# Patient Record
Sex: Female | Born: 1945 | Race: White | Hispanic: No | Marital: Single | Smoking: Never smoker
Health system: Southern US, Community
[De-identification: ages and names within clinical notes are randomized; demographics above are authoritative.]

## PROBLEM LIST (undated history)

## (undated) DIAGNOSIS — N83209 Unspecified ovarian cyst, unspecified side: Secondary | ICD-10-CM

## (undated) DIAGNOSIS — Z780 Asymptomatic menopausal state: Secondary | ICD-10-CM

## (undated) DIAGNOSIS — H9190 Unspecified hearing loss, unspecified ear: Secondary | ICD-10-CM

## (undated) DIAGNOSIS — M47812 Spondylosis without myelopathy or radiculopathy, cervical region: Secondary | ICD-10-CM

## (undated) DIAGNOSIS — M8580 Other specified disorders of bone density and structure, unspecified site: Secondary | ICD-10-CM

## (undated) DIAGNOSIS — M47816 Spondylosis without myelopathy or radiculopathy, lumbar region: Secondary | ICD-10-CM

## (undated) DIAGNOSIS — B001 Herpesviral vesicular dermatitis: Secondary | ICD-10-CM

## (undated) DIAGNOSIS — K589 Irritable bowel syndrome without diarrhea: Secondary | ICD-10-CM

## (undated) HISTORY — DX: Unspecified ovarian cyst, unspecified side: N83.209

## (undated) HISTORY — DX: Herpesviral vesicular dermatitis: B00.1

## (undated) HISTORY — PX: COLONOSCOPY: SHX174

## (undated) HISTORY — DX: Asymptomatic menopausal state: Z78.0

## (undated) HISTORY — DX: Unspecified hearing loss, unspecified ear: H91.90

## (undated) HISTORY — DX: Irritable bowel syndrome, unspecified: K58.9

## (undated) HISTORY — DX: Other specified disorders of bone density and structure, unspecified site: M85.80

## (undated) HISTORY — DX: Spondylosis without myelopathy or radiculopathy, lumbar region: M47.816

## (undated) HISTORY — DX: Spondylosis without myelopathy or radiculopathy, cervical region: M47.812

---

## 1974-07-01 HISTORY — PX: SALPINGOOPHORECTOMY: SHX82

## 1998-06-05 ENCOUNTER — Ambulatory Visit (HOSPITAL_COMMUNITY): Admission: RE | Admit: 1998-06-05 | Discharge: 1998-06-05 | Payer: Self-pay | Admitting: Obstetrics and Gynecology

## 1999-06-06 ENCOUNTER — Ambulatory Visit (HOSPITAL_COMMUNITY): Admission: RE | Admit: 1999-06-06 | Discharge: 1999-06-06 | Payer: Self-pay | Admitting: Obstetrics and Gynecology

## 1999-06-06 ENCOUNTER — Encounter: Payer: Self-pay | Admitting: Obstetrics and Gynecology

## 2000-06-11 ENCOUNTER — Encounter: Payer: Self-pay | Admitting: Obstetrics and Gynecology

## 2000-06-11 ENCOUNTER — Ambulatory Visit (HOSPITAL_COMMUNITY): Admission: RE | Admit: 2000-06-11 | Discharge: 2000-06-11 | Payer: Self-pay | Admitting: Obstetrics and Gynecology

## 2000-06-13 ENCOUNTER — Encounter: Admission: RE | Admit: 2000-06-13 | Discharge: 2000-06-13 | Payer: Self-pay | Admitting: Obstetrics and Gynecology

## 2000-06-13 ENCOUNTER — Encounter: Payer: Self-pay | Admitting: Obstetrics and Gynecology

## 2001-06-12 ENCOUNTER — Other Ambulatory Visit: Admission: RE | Admit: 2001-06-12 | Discharge: 2001-06-12 | Payer: Self-pay | Admitting: Obstetrics and Gynecology

## 2001-06-16 ENCOUNTER — Encounter: Admission: RE | Admit: 2001-06-16 | Discharge: 2001-06-16 | Payer: Self-pay | Admitting: Obstetrics and Gynecology

## 2001-06-16 ENCOUNTER — Encounter: Payer: Self-pay | Admitting: Obstetrics and Gynecology

## 2002-06-16 ENCOUNTER — Ambulatory Visit (HOSPITAL_COMMUNITY): Admission: RE | Admit: 2002-06-16 | Discharge: 2002-06-16 | Payer: Self-pay | Admitting: Obstetrics and Gynecology

## 2002-06-16 ENCOUNTER — Other Ambulatory Visit: Admission: RE | Admit: 2002-06-16 | Discharge: 2002-06-16 | Payer: Self-pay | Admitting: Obstetrics and Gynecology

## 2002-06-16 ENCOUNTER — Encounter: Payer: Self-pay | Admitting: Obstetrics and Gynecology

## 2003-10-22 ENCOUNTER — Emergency Department (HOSPITAL_COMMUNITY): Admission: EM | Admit: 2003-10-22 | Discharge: 2003-10-22 | Payer: Self-pay | Admitting: Emergency Medicine

## 2004-04-19 ENCOUNTER — Other Ambulatory Visit: Admission: RE | Admit: 2004-04-19 | Discharge: 2004-04-19 | Payer: Self-pay | Admitting: Obstetrics and Gynecology

## 2004-04-19 ENCOUNTER — Ambulatory Visit (HOSPITAL_COMMUNITY): Admission: RE | Admit: 2004-04-19 | Discharge: 2004-04-19 | Payer: Self-pay | Admitting: Obstetrics and Gynecology

## 2005-04-24 ENCOUNTER — Other Ambulatory Visit: Admission: RE | Admit: 2005-04-24 | Discharge: 2005-04-24 | Payer: Self-pay | Admitting: Obstetrics and Gynecology

## 2005-04-24 ENCOUNTER — Ambulatory Visit (HOSPITAL_COMMUNITY): Admission: RE | Admit: 2005-04-24 | Discharge: 2005-04-24 | Payer: Self-pay | Admitting: Obstetrics and Gynecology

## 2006-04-25 ENCOUNTER — Other Ambulatory Visit: Admission: RE | Admit: 2006-04-25 | Discharge: 2006-04-25 | Payer: Self-pay | Admitting: Obstetrics and Gynecology

## 2007-05-04 ENCOUNTER — Other Ambulatory Visit: Admission: RE | Admit: 2007-05-04 | Discharge: 2007-05-04 | Payer: Self-pay | Admitting: Obstetrics and Gynecology

## 2007-05-19 ENCOUNTER — Ambulatory Visit: Payer: Self-pay | Admitting: Gastroenterology

## 2008-05-05 ENCOUNTER — Other Ambulatory Visit: Admission: RE | Admit: 2008-05-05 | Discharge: 2008-05-05 | Payer: Self-pay | Admitting: Obstetrics and Gynecology

## 2008-05-05 ENCOUNTER — Ambulatory Visit: Payer: Self-pay | Admitting: Obstetrics and Gynecology

## 2008-05-05 ENCOUNTER — Encounter: Payer: Self-pay | Admitting: Obstetrics and Gynecology

## 2009-05-15 ENCOUNTER — Other Ambulatory Visit: Admission: RE | Admit: 2009-05-15 | Discharge: 2009-05-15 | Payer: Self-pay | Admitting: Obstetrics and Gynecology

## 2009-05-15 ENCOUNTER — Ambulatory Visit: Payer: Self-pay | Admitting: Obstetrics and Gynecology

## 2009-05-15 ENCOUNTER — Encounter: Payer: Self-pay | Admitting: Obstetrics and Gynecology

## 2009-12-07 ENCOUNTER — Encounter: Admission: RE | Admit: 2009-12-07 | Discharge: 2009-12-07 | Payer: Self-pay | Admitting: Family Medicine

## 2010-05-17 ENCOUNTER — Ambulatory Visit: Payer: Self-pay | Admitting: Obstetrics and Gynecology

## 2010-05-17 ENCOUNTER — Other Ambulatory Visit: Admission: RE | Admit: 2010-05-17 | Discharge: 2010-05-17 | Payer: Self-pay | Admitting: Obstetrics and Gynecology

## 2010-05-18 ENCOUNTER — Ambulatory Visit: Payer: Self-pay | Admitting: Obstetrics and Gynecology

## 2011-05-16 ENCOUNTER — Encounter: Payer: Self-pay | Admitting: Obstetrics and Gynecology

## 2011-05-20 ENCOUNTER — Other Ambulatory Visit: Payer: Self-pay

## 2011-05-20 DIAGNOSIS — N62 Hypertrophy of breast: Secondary | ICD-10-CM

## 2011-05-22 ENCOUNTER — Other Ambulatory Visit: Payer: Self-pay | Admitting: Obstetrics and Gynecology

## 2011-05-22 DIAGNOSIS — N62 Hypertrophy of breast: Secondary | ICD-10-CM

## 2011-05-29 ENCOUNTER — Encounter: Payer: Self-pay | Admitting: *Deleted

## 2011-05-29 DIAGNOSIS — M8580 Other specified disorders of bone density and structure, unspecified site: Secondary | ICD-10-CM | POA: Insufficient documentation

## 2011-06-05 ENCOUNTER — Ambulatory Visit (INDEPENDENT_AMBULATORY_CARE_PROVIDER_SITE_OTHER): Payer: BC Managed Care – PPO | Admitting: Obstetrics and Gynecology

## 2011-06-05 ENCOUNTER — Other Ambulatory Visit (HOSPITAL_COMMUNITY)
Admission: RE | Admit: 2011-06-05 | Discharge: 2011-06-05 | Disposition: A | Discharge status: Home / Self Care | Payer: BC Managed Care – PPO | Source: Ambulatory Visit | Attending: Obstetrics and Gynecology | Admitting: Obstetrics and Gynecology

## 2011-06-05 ENCOUNTER — Encounter: Payer: Self-pay | Admitting: Obstetrics and Gynecology

## 2011-06-05 VITALS — BP 112/64 | Ht 64.0 in | Wt 118.0 lb

## 2011-06-05 DIAGNOSIS — Z01419 Encounter for gynecological examination (general) (routine) without abnormal findings: Secondary | ICD-10-CM | POA: Insufficient documentation

## 2011-06-05 DIAGNOSIS — Z833 Family history of diabetes mellitus: Secondary | ICD-10-CM

## 2011-06-05 DIAGNOSIS — N83209 Unspecified ovarian cyst, unspecified side: Secondary | ICD-10-CM | POA: Insufficient documentation

## 2011-06-05 DIAGNOSIS — Z13220 Encounter for screening for lipoid disorders: Secondary | ICD-10-CM

## 2011-06-05 NOTE — Progress Notes (Signed)
The patient came to see me today for her annual GYN exam. She is not having menopausal symptoms. She is up-to-date on mammograms. She does have atrophic vaginitis but is asymptomatic. She does have low bone mass. She previously took Actonel but has been off of it for a long period of time. Her last bone density was 2007. She is taking calcium and vitamin D. She's had no fractures. She is having no vaginal bleeding or pelvic pain.  Physical examination:  Jeanette Knapp present. HEENT within normal limits. Neck: Thyroid not large. No masses. Supraclavicular nodes: not enlarged. Breasts: Examined in both sitting midline position. No skin changes and no masses. Abdomen: Soft no guarding rebound or masses or hernia. Pelvic: External: Within normal limits. BUS: Within normal limits. Vaginal:within normal limits. Poor estrogen effect. No evidence of cystocele rectocele or enterocele. Cervix: clean. Uterus: Normal size and shape. Adnexa: No masses. Rectovaginal exam: Confirmatory and negative. Extremities: Within normal limits.  Assessment: #1. Atrophic vaginitis #2. Low bone mass Plan: Yearly mammograms. Follow up bone density.

## 2011-06-05 NOTE — Patient Instructions (Signed)
Scheduled bone density

## 2011-06-13 ENCOUNTER — Telehealth: Payer: Self-pay | Admitting: *Deleted

## 2011-06-13 NOTE — Telephone Encounter (Signed)
Pt called wanting to know if her LDL in chol. Lab results. Pt informed that DG didn't order in LDL. Pt okay with this

## 2011-08-08 ENCOUNTER — Other Ambulatory Visit: Payer: Self-pay | Admitting: Family Medicine

## 2011-08-08 DIAGNOSIS — R142 Eructation: Secondary | ICD-10-CM

## 2011-08-09 ENCOUNTER — Ambulatory Visit
Admission: RE | Admit: 2011-08-09 | Discharge: 2011-08-09 | Disposition: A | Discharge status: Home / Self Care | Payer: BC Managed Care – PPO | Source: Ambulatory Visit | Attending: Family Medicine | Admitting: Family Medicine

## 2011-08-09 DIAGNOSIS — R142 Eructation: Secondary | ICD-10-CM

## 2011-12-09 ENCOUNTER — Other Ambulatory Visit (HOSPITAL_COMMUNITY): Payer: Self-pay | Admitting: Family Medicine

## 2011-12-09 DIAGNOSIS — R109 Unspecified abdominal pain: Secondary | ICD-10-CM

## 2011-12-24 ENCOUNTER — Other Ambulatory Visit (HOSPITAL_COMMUNITY): Payer: BC Managed Care – PPO

## 2012-03-01 DIAGNOSIS — S92309A Fracture of unspecified metatarsal bone(s), unspecified foot, initial encounter for closed fracture: Secondary | ICD-10-CM

## 2012-03-01 HISTORY — DX: Fracture of unspecified metatarsal bone(s), unspecified foot, initial encounter for closed fracture: S92.309A

## 2012-06-18 ENCOUNTER — Encounter: Payer: Self-pay | Admitting: Obstetrics and Gynecology

## 2012-10-13 ENCOUNTER — Other Ambulatory Visit: Payer: Self-pay | Admitting: Dermatology

## 2013-12-16 ENCOUNTER — Other Ambulatory Visit: Payer: Self-pay | Admitting: Family Medicine

## 2013-12-16 ENCOUNTER — Ambulatory Visit
Admission: RE | Admit: 2013-12-16 | Discharge: 2013-12-16 | Disposition: A | Discharge status: Home / Self Care | Payer: Medicare Other | Source: Ambulatory Visit | Attending: Family Medicine | Admitting: Family Medicine

## 2013-12-16 DIAGNOSIS — M542 Cervicalgia: Secondary | ICD-10-CM

## 2018-03-27 ENCOUNTER — Emergency Department (HOSPITAL_COMMUNITY)
Admission: EM | Admit: 2018-03-27 | Discharge: 2018-03-27 | Disposition: A | Discharge status: Home / Self Care | Payer: Medicare Other | Attending: Emergency Medicine | Admitting: Emergency Medicine

## 2018-03-27 ENCOUNTER — Emergency Department (HOSPITAL_COMMUNITY): Payer: Medicare Other

## 2018-03-27 ENCOUNTER — Encounter (HOSPITAL_COMMUNITY): Payer: Self-pay | Admitting: *Deleted

## 2018-03-27 DIAGNOSIS — R070 Pain in throat: Secondary | ICD-10-CM | POA: Diagnosis not present

## 2018-03-27 DIAGNOSIS — J36 Peritonsillar abscess: Secondary | ICD-10-CM | POA: Diagnosis not present

## 2018-03-27 DIAGNOSIS — R1310 Dysphagia, unspecified: Secondary | ICD-10-CM | POA: Diagnosis not present

## 2018-03-27 DIAGNOSIS — M542 Cervicalgia: Secondary | ICD-10-CM | POA: Diagnosis present

## 2018-03-27 HISTORY — DX: Peritonsillar abscess: J36

## 2018-03-27 LAB — CBC WITH DIFFERENTIAL/PLATELET
BASOS PCT: 0 %
Basophils Absolute: 0 10*3/uL (ref 0.0–0.1)
EOS ABS: 0 10*3/uL (ref 0.0–0.7)
Eosinophils Relative: 0 %
HCT: 37 % (ref 36.0–46.0)
HEMOGLOBIN: 12.5 g/dL (ref 12.0–15.0)
LYMPHS PCT: 12 %
Lymphs Abs: 2.1 10*3/uL (ref 0.7–4.0)
MCH: 30.2 pg (ref 26.0–34.0)
MCHC: 33.8 g/dL (ref 30.0–36.0)
MCV: 89.4 fL (ref 78.0–100.0)
Monocytes Absolute: 1 10*3/uL (ref 0.1–1.0)
Monocytes Relative: 6 %
NEUTROS ABS: 14.3 10*3/uL — AB (ref 1.7–7.7)
Neutrophils Relative %: 82 %
Platelets: 400 10*3/uL (ref 150–400)
RBC: 4.14 MIL/uL (ref 3.87–5.11)
RDW: 13.3 % (ref 11.5–15.5)
WBC: 17.4 10*3/uL — ABNORMAL HIGH (ref 4.0–10.5)

## 2018-03-27 LAB — BASIC METABOLIC PANEL
ANION GAP: 14 (ref 5–15)
BUN: 7 mg/dL — ABNORMAL LOW (ref 8–23)
CHLORIDE: 101 mmol/L (ref 98–111)
CO2: 26 mmol/L (ref 22–32)
Calcium: 9.4 mg/dL (ref 8.9–10.3)
Creatinine, Ser: 0.8 mg/dL (ref 0.44–1.00)
GFR calc non Af Amer: >60 mL/min (ref >60)
Glucose, Bld: 107 mg/dL — ABNORMAL HIGH (ref 70–99)
Potassium: 3.5 mmol/L (ref 3.5–5.1)
SODIUM: 141 mmol/L (ref 135–145)

## 2018-03-27 MED ORDER — IOHEXOL 300 MG/ML  SOLN
75.0000 mL | Freq: Once | INTRAMUSCULAR | Status: AC | PRN
  Administered 2018-03-27: 75 mL via INTRAVENOUS

## 2018-03-27 MED ORDER — DEXAMETHASONE SODIUM PHOSPHATE 10 MG/ML IJ SOLN
8.0000 mg | Freq: Once | INTRAMUSCULAR | Status: AC
  Administered 2018-03-27: 8 mg via INTRAVENOUS
  Filled 2018-03-27: qty 1

## 2018-03-27 MED ORDER — CLINDAMYCIN PHOSPHATE 600 MG/50ML IV SOLN
600.0000 mg | Freq: Once | INTRAVENOUS | Status: AC
  Administered 2018-03-27: 600 mg via INTRAVENOUS
  Filled 2018-03-27: qty 50

## 2018-03-27 MED ORDER — HYDROCODONE-ACETAMINOPHEN 5-325 MG PO TABS: 1.0000 | ORAL_TABLET | Freq: Four times a day (QID) | ORAL | 0 refills | Status: DC | PRN

## 2018-03-27 MED ORDER — SODIUM CHLORIDE 0.9 % IV BOLUS
1000.0000 mL | Freq: Once | INTRAVENOUS | Status: AC
  Administered 2018-03-27: 1000 mL via INTRAVENOUS

## 2018-03-27 MED ORDER — SODIUM CHLORIDE 0.9 % IJ SOLN
INTRAMUSCULAR | Status: AC
  Filled 2018-03-27: qty 50

## 2018-03-27 MED ORDER — CLINDAMYCIN HCL 150 MG PO CAPS: 150.0000 mg | ORAL_CAPSULE | Freq: Four times a day (QID) | ORAL | 0 refills | Status: DC

## 2018-03-27 MED ORDER — MORPHINE SULFATE (PF) 2 MG/ML IV SOLN
2.0000 mg | Freq: Once | INTRAVENOUS | Status: DC
  Filled 2018-03-27: qty 1

## 2018-03-27 NOTE — ED Triage Notes (Signed)
Pt complains of painful and difficult swallowing, worse since this morning. Pt feels like something is stuck in her throat. Pt recently finished taking z-pack. Pt has been on liquid diet for 3 days due to difficulty swallowing. Pt has ENT appointment today at 3PM.

## 2018-03-27 NOTE — Discharge Instructions (Signed)
Please follow-up with Dr. Annalee Genta  (ENT) at 1:45 this afternoon.  Return for any problem.

## 2018-03-27 NOTE — ED Provider Notes (Signed)
[facility]-EMERGENCY DEPT Provider Note   CSN: [id number] Arrival date & time: 03/27/18  0751     History   Chief Complaint Chief Complaint  Patient presents with  . Dysphagia  . Sore Throat    HPI Jeanette Knapp is a 72 y.o. female.  72 year old female with prior medical history as documented below presents for evaluation of left sided neck pain and discomfort.  Patient reports ongoing symptoms for the last 1-1/2 weeks.  She has been seen by urgent care and her regular doctor for the same complaint.  She has already completed one course of Z-Pak as an outpatient.  She complains of pain to the left side of her neck and difficulty with swallowing.  She denies fever.  She denies prior episodes of similar complaints.  She has been referred to ENT and has an appointment with Dr. Jearld Fenton [facility] ENT) this afternoon at 3 PM.  This morning she felt like her pain was worse and she is having more trouble swallowing so she came to the [facility] ED for evaluation.  She denies difficulty speaking.  She denies difficulty breathing.  She complains of increased pain to the left side of her neck with swallowing.  The history is provided by the patient and medical records.  Sore Throat  This is a new problem. The current episode started more than 1 week ago. The problem occurs rarely. The problem has been gradually worsening. Pertinent negatives include no chest pain, no abdominal pain, no headaches and no shortness of breath. Nothing aggravates the symptoms. Nothing relieves the symptoms.    Past Medical History:  Diagnosis Date  . Fever blister   . Osteopenia   . Ovarian cyst     Patient Active Problem List   Diagnosis Date Noted  . Ovarian cyst   . Osteopenia     Past Surgical History:  Procedure Laterality Date  . SALPINGOOPHORECTOMY  1976   right. ovarian cyst     OB History    Gravida  0   Para      Term      Preterm      AB      Living        SAB      TAB      Ectopic      Multiple      Live Births               Home Medications    Prior to Admission medications   Medication Sig Start Date End Date Taking? Authorizing Provider  ACYCLOVIR PO Take by mouth.      [provider]  Calcium Carbonate Antacid (TUMS PO) Take by mouth.      [provider]  Calcium Carbonate-Vitamin D (CALCIUM + D PO) Take by mouth.      [provider]  Multiple Vitamin (MULTIVITAMIN) capsule Take 1 capsule by mouth daily.      [provider]    Family History Family History  Problem Relation Age of Onset  . Hypertension Mother   . Ovarian cancer Mother   . Cancer Mother        Multiple myloma-Ovarian cancr  . Diabetes Father   . Lung cancer Father   . Heart disease Father   . Colon cancer Maternal Grandfather     Social History Social History   Tobacco Use  . Smoking status: Never Smoker  Substance Use Topics  . Alcohol use: Yes  Alcohol/week: 2.0 standard drinks    Types: 2 Standard drinks or equivalent per week    Comment: rare  . Drug use: Not on file     Allergies   Penicillins and Prozac [fluoxetine hcl]   Review of Systems Review of Systems  Respiratory: Negative for shortness of breath.   Cardiovascular: Negative for chest pain.  Gastrointestinal: Negative for abdominal pain.  Neurological: Negative for headaches.  All other systems reviewed and are negative.    Physical Exam Updated Vital Signs BP (!) 138/93 (BP Location: Right Arm)   Pulse (!) 113   Temp 98.6 F (37 C) (Oral)   Resp 18   SpO2 99%   Physical Exam  Constitutional: She is oriented to person, place, and time. She appears well-developed and well-nourished. No distress.  HENT:  Head: Normocephalic and atraumatic.  Mouth/Throat: Oropharynx is clear and moist.  Uvula deviates to right  Erythema and edema noted on left peritonsillar area  Normal phonation   Palpable mass (non pulsatile) noted to  left neck just inferior to angle of mandible   Eyes: Pupils are equal, round, and reactive to light. Conjunctivae and EOM are normal.  Neck: Normal range of motion. Neck supple.  Cardiovascular: Normal rate, regular rhythm and normal heart sounds.  Pulmonary/Chest: Effort normal and breath sounds normal. No respiratory distress.  Abdominal: Soft. She exhibits no distension. There is no tenderness.  Musculoskeletal: Normal range of motion. She exhibits no edema or deformity.  Neurological: She is alert and oriented to person, place, and time.  Skin: Skin is warm and dry.  Psychiatric: She has a normal mood and affect.  Nursing note and vitals reviewed.    ED Treatments / Results  Labs (all labs ordered are listed, but only abnormal results are displayed) Labs Reviewed  CBC WITH DIFFERENTIAL/PLATELET  BASIC METABOLIC PANEL    EKG None  Radiology No results found.  Procedures Procedures (including critical care time)  Medications Ordered in ED Medications  sodium chloride 0.9 % bolus 1,000 mL (has no administration in time range)  clindamycin (CLEOCIN) IVPB 600 mg (has no administration in time range)  dexamethasone (DECADRON) injection 8 mg (has no administration in time range)     Initial Impression / Assessment and Plan / ED Course  I have reviewed the triage vital signs and the nursing notes.  Pertinent labs & imaging results that were available during my care of the patient were reviewed by me and considered in my medical decision making (see chart for details).     MDM  Screen complete  Patient is presenting for evaluation of left-sided throat pain.  Work-up reveals a left-sided peritonsillar abscess.  Patient appears to be stable for discharge.  Case was discussed with the ENT -patient is to follow-up with ENT (Dr. Annalee Genta)  in their office later today for tentative treatment.  Patient understands need for close follow-up.  Strict return precautions given and  understood.  Final Clinical Impressions(s) / ED Diagnoses   Final diagnoses:  Peritonsillar abscess    ED Discharge Orders         Ordered    clindamycin (CLEOCIN) 150 MG capsule  Every 6 hours     03/27/18 1126    HYDROcodone-acetaminophen (NORCO/VICODIN) 5-325 MG tablet  Every 6 hours PRN     03/27/18 1126           Wynetta Fines, MD 03/27/18 1128

## 2019-07-29 ENCOUNTER — Ambulatory Visit: Payer: Medicare Other

## 2019-08-05 ENCOUNTER — Ambulatory Visit: Payer: Medicare Other | Attending: Internal Medicine

## 2019-08-05 DIAGNOSIS — Z23 Encounter for immunization: Secondary | ICD-10-CM | POA: Insufficient documentation

## 2019-08-05 NOTE — Progress Notes (Signed)
   Covid-19 Vaccination Clinic  Name:  Jeanette Knapp    MRN: [id number] DOB: 1945-10-05  08/05/2019  Jeanette Knapp was observed post Covid-19 immunization for 15 minutes without incidence. She was provided with Vaccine Information Sheet and instruction to access the V-Safe system.   Jeanette Knapp was instructed to call 911 with any severe reactions post vaccine: Marland Kitchen Difficulty breathing  . Swelling of your face and throat  . A fast heartbeat  . A bad rash all over your body  . Dizziness and weakness    Immunizations Administered    Name Date Dose VIS Date Route   Pfizer COVID-19 Vaccine 08/05/2019  8:25 AM 0.3 mL 06/11/2019 Intramuscular   Manufacturer: [employer], [employer]   Lot: [id number]   NDC: [id number]

## 2019-08-09 ENCOUNTER — Ambulatory Visit: Payer: Medicare Other

## 2019-08-30 ENCOUNTER — Ambulatory Visit: Payer: Medicare Other | Attending: Internal Medicine

## 2019-08-30 DIAGNOSIS — Z23 Encounter for immunization: Secondary | ICD-10-CM

## 2019-08-30 NOTE — Progress Notes (Signed)
   Covid-19 Vaccination Clinic  Name:  Jeanette Knapp    MRN: [id number] DOB: [birth year]  08/30/2019  [name] was observed post Covid-19 immunization for 15 minutes without incidence. She was provided with Vaccine Information Sheet and instruction to access the V-Safe system.   [name] was instructed to call 911 with any severe reactions post vaccine: Marland Kitchen Difficulty breathing  . Swelling of your face and throat  . A fast heartbeat  . A bad rash all over your body  . Dizziness and weakness    Immunizations Administered    Name Date Dose VIS Date Route   Pfizer COVID-19 Vaccine 08/30/2019  1:03 PM 0.3 mL 06/11/2019 Intramuscular   Manufacturer: [employer], [employer]   Lot: [id number]   NDC: [address]-9

## 2019-11-02 NOTE — Progress Notes (Signed)
[facility]    Provider:  Dr Jaynee Eagles Requesting Provider: Lawerance Cruel, MD Primary Care Provider:  Lawerance Cruel, MD  CC:  Headaches, loss of balance and nausea  HPI:  Jeanette Knapp is a 74 y.o. female here as requested by Lawerance Cruel, MD for headaches. PMHx osteoarthritis of the lumbar spine unspecified spinal osteoarthritis, paresthesias, herpes labialis, IBS, osteopenia, hearing loss wears bilateral aids.  I reviewed Dr. Alan Ripper notes, headache shoulder and neck pain, tendinitis in the right arm with headaches sees Dr. Sabra Heck and chiropractic, flare has been going on for 4 weeks, was given meloxicam previously which did nothing for the pain, headache for 4 weeks, she has had head pain in the past which was neck related, this time it feels like a nail in her head extremely intense I reviewed the rest of his notes with did not have anything further significant for headaches nausea or imbalance.  She has a pain in the top of the head (points to just right of the vertex) off and on for years Feels like a nail being driven in her head. Not a dull ache a sharp ache. brieft but continuous for days , advil helps sometimes, it may wake her up in the middl of the night, she has degenerative issues in her neck and seems to think it is coming from her neck. Chropractic helped in the past. She hasn't had it in over a year, unknown what triggered it this time, it was extremely intense worse it has ever been and it kept going "on and on" it did start after her second covid shot unclear if related. She has pain and tenderness on the right (points to the occipital nerve). Generic celebrex helped tremendously. They happen once a year or a few times a year can last 1-2 weeks but never this intense. She has been on the phone reading a lot. Using a lot of upper shoulder and neck can trigger. No other focal neurologic deficits, associated symptoms, inciting events or modifiable  factors.  Reviewed notes, labs and imaging from outside physicians, which showed:  Bmp in 2019 was unremarkable but CBC had wbcs 17.4(03/27/2018) no more recent labs available either in Epic of Care Everywhere  DG cervical spine 2015: reviewed images and agree with the following:  FINDINGS: Frontal, lateral, and open-mouth odontoid images were obtained. There is no demonstrable fracture or spondylolisthesis. Prevertebral soft tissues and predental space regions are normal. There is rather minimal disc space narrowing at C6-7 and C7-T1. There are small anterior osteophytes at C4 and C5. No erosive change.  IMPRESSION: Slight osteoarthritic change. No apparent fracture or spondylolisthesis  Review of Systems: Patient complains of symptoms per HPI as well as the following symptoms anxiety. Pertinent negatives and positives per HPI. All others negative.   Social History   Socioeconomic History   Marital status: Single    Spouse name: Not on file   Number of children: 0   Years of education: 2 yr college   Highest education level: Not on file  Occupational History   Not on file  Tobacco Use   Smoking status: Never Smoker   Smokeless tobacco: Never Used  Substance and Sexual Activity   Alcohol use: Yes    Comment: rare, social   Drug use: Never   Sexual activity: Never    Birth control/protection: Post-menopausal  Other Topics Concern   Not on file  Social History Narrative   Caffeine 1-2 cups/day   Lives  alone   Social Determinants of Health   Financial Resource Strain:    Difficulty of Paying Living Expenses:   Food Insecurity:    Worried About Programme researcher, broadcasting/film/video in the Last Year:    Barista in the Last Year:   Transportation Needs:    Freight forwarder (Medical):    Lack of Transportation (Non-Medical):   Physical Activity:    Days of Exercise per Week:    Minutes of Exercise per Session:   Stress:    Feeling of Stress :     Social Connections:    Frequency of Communication with Friends and Family:    Frequency of Social Gatherings with Friends and Family:    Attends Religious Services:    Active Member of Clubs or Organizations:    Attends Engineer, structural:    Marital Status:   Intimate Partner Violence:    Fear of Current or Ex-Partner:    Emotionally Abused:    Physically Abused:    Sexually Abused:     Family History  Problem Relation Age of Onset   Hypertension Mother    Ovarian cancer Mother    Cancer Mother        Multiple myloma-Ovarian cancr   Headache Mother    Diabetes Father    Lung cancer Father    Heart disease Father    Heart attack Father    Colon cancer Maternal Grandfather     Past Medical History:  Diagnosis Date   DJD (degenerative joint disease), cervical    DJD (degenerative joint disease), lumbar    Fever blister    Fx metatarsal 03/2012   L 5th   Hearing loss    wears aids Bil   IBS (irritable bowel syndrome)    stress related   Osteopenia    Ovarian cyst    Peritonsillar abscess 03/27/2018   Dr. Annalee Genta; went to ED   Postmenopausal     Patient Active Problem List   Diagnosis Date Noted   Occipital neuralgia of right side 11/03/2019   Ovarian cyst    Osteopenia     Past Surgical History:  Procedure Laterality Date   COLONOSCOPY     with polyp resection; repeat colonoscopy 08/2011 Dr. Laural Benes   SALPINGOOPHORECTOMY  1976   right. ovarian cyst. pt states this was on the L     Current Outpatient Medications  Medication Sig Dispense Refill   ACYCLOVIR PO Take by mouth. As needed     ALPRAZolam (XANAX) 0.25 MG tablet Take 0.25 mg by mouth as needed for anxiety.     citalopram (CELEXA) 20 MG tablet Take 20 mg by mouth daily.     diphenhydramine-acetaminophen (TYLENOL PM) 25-500 MG TABS tablet Take 1 tablet by mouth at bedtime as needed.     fluticasone (FLONASE) 50 MCG/ACT nasal spray Place 1 spray  into both nostrils. As needed     Multiple Vitamin (MULTIVITAMIN) capsule Take 1 capsule by mouth daily.       vitamin E (VITAMIN E) 180 MG (400 UNITS) capsule Take 400 Units by mouth daily.     No current facility-administered medications for this visit.    Allergies as of 11/03/2019 - Review Complete 11/03/2019  Allergen Reaction Noted   Penicillins  05/29/2011   Prozac [fluoxetine hcl]  05/29/2011   Clindamycin/lincomycin Rash 11/03/2019    Vitals: BP 126/78 (BP Location: Left Arm, Patient Position: Sitting)    Pulse 74    Temp  97.9 F (36.6 C) Comment: taken at front   Ht 5' 4.5" (1.638 m)    Wt 123 lb (55.8 kg)    BMI 20.79 kg/m  Last Weight:  Wt Readings from Last 1 Encounters:  11/03/19 123 lb (55.8 kg)   Last Height:   Ht Readings from Last 1 Encounters:  11/03/19 5' 4.5" (1.638 m)     Physical exam: Exam: Gen: NAD, slightly anxious, conversant                  CV: RRR, no MRG. No Carotid Bruits. No peripheral edema, warm, nontender Eyes: Conjunctivae clear without exudates or hemorrhage  Neuro: Detailed Neurologic Exam  Speech:    Speech is normal; fluent and spontaneous with normal comprehension.  Cognition:    The patient is oriented to person, place, and time;     recent and remote memory intact;     language fluent;     normal attention, concentration,     fund of knowledge Cranial Nerves:    The pupils are pinpoint,  equal, round, and reactive to light.attempted fundoscopy, pupils too small.  . Visual fields are full to finger confrontation. Extraocular movements are intact. Trigeminal sensation is intact and the muscles of mastication are normal. The face is symmetric. The palate elevates in the midline. Hearing intact. Voice is normal. Shoulder shrug is normal. The tongue has normal motion without fasciculations.   Coordination:    No dysmetria or ataxia  Gait:    Normal native gait  Motor Observation:    No asymmetry, no atrophy, and no  involuntary movements noted. Tone:    Normal muscle tone.    Posture:    Posture is normal. normal erect    Strength:    Strength is V/V in the upper and lower limbs.      Sensation: intact to LT     Reflex Exam:  DTR's:    Deep tendon reflexes in the upper and lower extremities are normal bilaterally.   Toes:    The toes are downgoing bilaterally.   Clonus:    Clonus is absent.    Assessment/Plan:  Appears to be irritation of the right occipital nerve causing symptoms.She has nerve pain in the distribution of the occipital nerve and she feels relief when she applies pressure to the lower occipital area at the emergence of the occipital nerve.  - we discussed occipital nerve, looked at images online, discussed nerve pain and causes of occipital neuralgia( Differential includes pain in the upper cervical joints or disks, suboccipital or upper posterior neck muscles including the traps/scm, spinal and posterior cranial fossa dura mater, vertebral arteries, structural and infiltrative lesions such as meningioma, schwannoma, myelitis, compressive disk disease and others)  - recommended MRI of the brain and cervical spine to rule out other causes(she declines), Blood work to rule out other causes also recommended.  - Discussed Treatment possibilities in depth: Medications such as Gabapentin, antiinflammatory medications, medial branch blocks(injections in the neck), physical therapy, occipital nerve blocks and she prefers to discuss with Dr. Hyacinth Meeker.  Discussed: To prevent or relieve headaches, try the following: Cool Compress. Lie down and place a cool compress on your head.  Avoid headache triggers. If certain foods or odors seem to have triggered your migraines in the past, avoid them. A headache diary might help you identify triggers.  Include physical activity in your daily routine. Try a daily walk or other moderate aerobic exercise.  Manage stress. Find healthy ways  to cope with the  stressors, such as delegating tasks on your to-do list.  Practice relaxation techniques. Try deep breathing, yoga, massage and visualization.  Eat regularly. Eating regularly scheduled meals and maintaining a healthy diet might help prevent headaches. Also, drink plenty of fluids.  Follow a regular sleep schedule. Sleep deprivation might contribute to headaches Consider biofeedback. With this mind-body technique, you learn to control certain bodily functions -- such as muscle tension, heart rate and blood pressure -- to prevent headaches or reduce headache pain.    Proceed to emergency room if you experience new or worsening symptoms or symptoms do not resolve, if you have new neurologic symptoms or if headache is severe, or for any concerning symptom.   Provided education and documentation from American headache Society toolbox including articles on: occipital neuralgia    Orders Placed This Encounter  Procedures   CBC with Differential/Platelets   Comprehensive metabolic panel   Sedimentation rate   C-reactive protein   TSH     Cc: Daisy Floro, MD,  Dr. Sigmund Hazel  Naomie Dean, MD  [facility] [address] [place], [place] [address]  Phone [phone] Fax [phone]  I spent 60 minutes of face-to-face and non-face-to-face time with patient on the  1. Occipital neuralgia of right side    diagnosis.  This included previsit chart review, lab review, study review, order entry, electronic health record documentation, patient education on the different diagnostic and therapeutic options, counseling and coordination of care, risks and benefits of management, compliance, or risk factor reduction

## 2019-11-03 ENCOUNTER — Ambulatory Visit: Payer: Medicare Other | Admitting: Neurology

## 2019-11-03 ENCOUNTER — Other Ambulatory Visit: Payer: Self-pay

## 2019-11-03 ENCOUNTER — Encounter: Payer: Self-pay | Admitting: Neurology

## 2019-11-03 VITALS — BP 126/78 | HR 74 | Temp 97.9°F | Ht 64.5 in | Wt 123.0 lb

## 2019-11-03 DIAGNOSIS — M5481 Occipital neuralgia: Secondary | ICD-10-CM

## 2019-11-03 NOTE — Patient Instructions (Addendum)
Appears to be irritation of the right occipital nerve causing symptoms Options include MRI of the brain and cervical spine to rule out other causes, Blood work to rule out other causes  Treatments: Medications such as Gabapentin, antiinflammatory medications, medial branch blocks(injections in the neck), physical therapy, occipital nerve blocks   Occipital Neuralgia  Occipital neuralgia is a type of headache that causes brief episodes of very bad pain in the back of your head. Pain from occipital neuralgia may spread (radiate) to other parts of your head. These headaches may be caused by irritation of the nerves that leave your spinal cord high up in your neck, just below the base of your skull (occipital nerves). Your occipital nerves transmit sensations from the back of your head, the top of your head, and the areas behind your ears. What are the causes? This condition can occur without any known cause (primary headache syndrome). In other cases, this condition is caused by pressure on or irritation of one of the two occipital nerves. Pressure and irritation may be due to:  Muscle spasm in the neck.  Neck injury.  Wear and tear of the vertebrae in the neck (osteoarthritis).  Disease of the disks that separate the vertebrae.  Swollen blood vessels that put pressure on the occipital nerves.  Infections.  Tumors.  Diabetes. What are the signs or symptoms? This condition causes brief burning, stabbing, electric, shocking, or shooting pain which can radiate to the top of the head. It can happen on one side or both sides of the head. It can also cause:  Pain behind the eye.  Pain triggered by neck movement or hair brushing.  Scalp tenderness.  Aching in the back of the head between episodes of very bad pain.  Pain gets worse with exposure to bright lights. How is this diagnosed? There is no test that diagnoses this condition. Your health care provider may diagnose this condition  based on a physical exam and your symptoms. Other tests may be done, such as:  Imaging studies of the brain and neck (cervical spine), such as an MRI or CT scan. These look for causes of pinched nerves.  Applying pressure to the nerves in the neck to try to re-create the pain.  Injection of numbing medicine into the occipital nerve areas to see if pain goes away (diagnostic nerve block). How is this treated? Treatment for this condition may begin with simple measures, such as:  Rest.  Massage.  Applying heat or cold on the area.  Over-the-counter pain relievers. If these measures do not work, you may need other treatments, including:  Medicines, such as: ? Prescription-strength anti-inflammatory medicines. ? Muscle relaxants. ? Anti-seizure medicines, which can relieve pain. ? Antidepressants, which can relieve pain. ? Injected medicines, such as medicines that numb the area (local anesthetic) and steroids.  Pulsed radiofrequency ablation. This is when wires are implanted to deliver electrical impulses that block pain signals from the occipital nerve.  Surgery to relieve nerve pressure.  Physical therapy. Follow these instructions at home: Pain management      Avoid any activities that cause pain.  Rest when you have an attack of pain.  Try gentle massage to relieve pain.  Try a different pillow or sleeping position.  If directed, apply heat to the affected area as told by your health care provider. Use the heat source that your health care provider recommends, such as a moist heat pack or a heating pad. ? Place a towel between your  skin and the heat source. ? Leave the heat on for 20-30 minutes. ? Remove the heat if your skin turns bright red. This is especially important if you are unable to feel pain, heat, or cold. You may have a greater risk of getting burned.  If directed, apply ice to the back of the head and neck area as told by your health care  provider. ? Put ice in a plastic bag. ? Place a towel between your skin and the bag. ? Leave the ice on for 20 minutes, 2-3 times per day. General instructions  Take over-the-counter and prescription medicines only as told by your health care provider.  Avoid things that make your symptoms worse, such as bright lights.  Try to stay active. Get regular exercise that does not cause pain. Ask your health care provider to suggest safe exercises for you.  Work with a physical therapist to learn stretching exercises you can do at home.  Practice good posture.  Keep all follow-up visits as told by your health care provider. This is important. Contact a health care provider if:  Your medicine is not working.  You have new or worsening symptoms. Get help right away if:  You have very bad head pain that does not go away.  You have a sudden change in vision, balance, or speech. Summary  Occipital neuralgia is a type of headache that causes brief episodes of very bad pain in the back of your head.  Pain from occipital neuralgia may spread (radiate) to other parts of your head.  Treatment for this condition includes rest, massage, and medicines. This information is not intended to replace advice given to you by your health care provider. Make sure you discuss any questions you have with your health care provider. Document Revised: 06/03/2017 Document Reviewed: 08/22/2016 Elsevier Patient Education  2020 [employer]   Gabapentin capsules or tablets What is this medicine? GABAPENTIN (GA ba pen tin) is used to control seizures in certain types of epilepsy. It is also used to treat certain types of nerve pain. This medicine may be used for other purposes; ask your health care provider or pharmacist if you have questions. COMMON BRAND NAME(S): Active-PAC with Gabapentin, Gabarone, Neurontin What should I tell my health care provider before I take this medicine? They need to know if you  have any of these conditions:  history of drug abuse or alcohol abuse problem  kidney disease  lung or breathing disease  suicidal thoughts, plans, or attempt; a previous suicide attempt by you or a family member  an unusual or allergic reaction to gabapentin, other medicines, foods, dyes, or preservatives  pregnant or trying to get pregnant  breast-feeding How should I use this medicine? Take this medicine by mouth with a glass of water. Follow the directions on the prescription label. You can take it with or without food. If it upsets your stomach, take it with food. Take your medicine at regular intervals. Do not take it more often than directed. Do not stop taking except on your doctor's advice. If you are directed to break the 600 or 800 mg tablets in half as part of your dose, the extra half tablet should be used for the next dose. If you have not used the extra half tablet within 28 days, it should be thrown away. A special MedGuide will be given to you by the pharmacist with each prescription and refill. Be sure to read this information carefully each time. Talk to  your pediatrician regarding the use of this medicine in children. While this drug may be prescribed for children as young as 3 years for selected conditions, precautions do apply. Overdosage: If you think you have taken too much of this medicine contact a poison control center or emergency room at once. NOTE: This medicine is only for you. Do not share this medicine with others. What if I miss a dose? If you miss a dose, take it as soon as you can. If it is almost time for your next dose, take only that dose. Do not take double or extra doses. What may interact with this medicine? This medicine may interact with the following medications:  alcohol  antihistamines for allergy, cough, and cold  certain medicines for anxiety or sleep  certain medicines for depression like amitriptyline, fluoxetine,  sertraline  certain medicines for seizures like phenobarbital, primidone  certain medicines for stomach problems  general anesthetics like halothane, isoflurane, methoxyflurane, propofol  local anesthetics like lidocaine, pramoxine, tetracaine  medicines that relax muscles for surgery  narcotic medicines for pain  phenothiazines like chlorpromazine, mesoridazine, prochlorperazine, thioridazine This list may not describe all possible interactions. Give your health care provider a list of all the medicines, herbs, non-prescription drugs, or dietary supplements you use. Also tell them if you smoke, drink alcohol, or use illegal drugs. Some items may interact with your medicine. What should I watch for while using this medicine? Visit your doctor or health care provider for regular checks on your progress. You may want to keep a record at home of how you feel your condition is responding to treatment. You may want to share this information with your doctor or health care provider at each visit. You should contact your doctor or health care provider if your seizures get worse or if you have any new types of seizures. Do not stop taking this medicine or any of your seizure medicines unless instructed by your doctor or health care provider. Stopping your medicine suddenly can increase your seizures or their severity. This medicine may cause serious skin reactions. They can happen weeks to months after starting the medicine. Contact your health care provider right away if you notice fevers or flu-like symptoms with a rash. The rash may be red or purple and then turn into blisters or peeling of the skin. Or, you might notice a red rash with swelling of the face, lips or lymph nodes in your neck or under your arms. Wear a medical identification bracelet or chain if you are taking this medicine for seizures, and carry a card that lists all your medications. You may get drowsy, dizzy, or have blurred vision.  Do not drive, use machinery, or do anything that needs mental alertness until you know how this medicine affects you. To reduce dizzy or fainting spells, do not sit or stand up quickly, especially if you are an older patient. Alcohol can increase drowsiness and dizziness. Avoid alcoholic drinks. Your mouth may get dry. Chewing sugarless gum or sucking hard candy, and drinking plenty of water will help. The use of this medicine may increase the chance of suicidal thoughts or actions. Pay special attention to how you are responding while on this medicine. Any worsening of mood, or thoughts of suicide or dying should be reported to your health care provider right away. Women who become pregnant while using this medicine may enroll in the [place] Antiepileptic Drug Pregnancy Registry by calling [phone]. This registry collects information about the safety  of antiepileptic drug use during pregnancy. What side effects may I notice from receiving this medicine? Side effects that you should report to your doctor or health care professional as soon as possible:  allergic reactions like skin rash, itching or hives, swelling of the face, lips, or tongue  breathing problems  rash, fever, and swollen lymph nodes  redness, blistering, peeling or loosening of the skin, including inside the mouth  suicidal thoughts, mood changes Side effects that usually do not require medical attention (report to your doctor or health care professional if they continue or are bothersome):  dizziness  drowsiness  headache  nausea, vomiting  swelling of ankles, feet, hands  tiredness This list may not describe all possible side effects. Call your doctor for medical advice about side effects. You may report side effects to FDA at 1-800-FDA-1088. Where should I keep my medicine? Keep out of reach of children. This medicine may cause accidental overdose and death if it taken by other adults, children, or  pets. Mix any unused medicine with a substance like cat litter or coffee grounds. Then throw the medicine away in a sealed container like a sealed bag or a coffee can with a lid. Do not use the medicine after the expiration date. Store at room temperature between 15 and 30 degrees C (59 and 86 degrees F). NOTE: This sheet is a summary. It may not cover all possible information. If you have questions about this medicine, talk to your doctor, pharmacist, or health care provider.  2020 Elsevier/Gold Standard (2018-09-18 14:16:43)  Occipital Nerve Block Patient Information  Description: The occipital nerves originate in the cervical (neck) spinal cord and travel upward through muscle and tissue to supply sensation to the back of the head and top of the scalp.  In addition, the nerves control some of the muscles of the scalp.  Occipital neuralgia is an irritation of these nerves which can cause headaches, numbness of the scalp, and neck discomfort.     The occipital nerve block will interrupt nerve transmission through these nerves and can relieve pain and spasm.  The block consists of insertion of a small needle under the skin in the back of the head to deposit local anesthetic (numbing medicine) and/or steroids around the nerve.  The entire block usually lasts less than 5 minutes.  Conditions which may be treated by occipital blocks:   Muscular pain and spasm of the scalp  Nerve irritation, back of the head  Headaches  Upper neck pain  Preparation for the injection:  5. Do not eat any solid food or dairy products within 8 hours of your appointment. 6. You may drink clear liquids up to 3 hours before appointment.  Clear liquids include water, black coffee, juice or soda.  No milk or cream please. 7. You may take your regular medication, including pain medications, with a sip of water before you appointment.  Diabetics should hold regular insulin (if taken separately) and take 1/2 normal NPH  dose the morning of the procedure.  Carry some sugar containing items with you to your appointment. 8. A driver must accompany you and be prepared to drive you home after your procedure. 9. Bring all your current medications with you. 10. An IV may be inserted and sedation may be given at the discretion of the physician. 11. A blood pressure cuff, EKG, and other monitors will often be applied during the procedure.  Some patients may need to have extra oxygen administered for a short  period. 12. You will be asked to provide medical information, including your allergies and medications, prior to the procedure.  We must know immediately if you are taking blood thinners (like Coumadin/Warfarin) or if you are allergic to IV iodine contrast (dye).  We must know if you could possible be pregnant.  13. Do not wear a high collared shirt or turtleneck.  Tie long hair up in the back if possible.  Possible side-effects:   Bleeding from needle site  Infection (rare, may require surgery)  Nerve injury (rare)  Hair on back of neck can be tinged with iodine scrub (this will wash out)  Light-headedness (temporary)  Pain at injection site (several days)  Decreased blood pressure (rare, temporary)  Seizure (very rare)  Call if you experience:   Hives or difficulty breathing ( go to the emergency room)  Inflammation or drainage at the injection site(s)  Please note:  Although the local anesthetic injected can often make your painful muscles or headache feel good for several hours after the injection, the pain may return.  It takes 3-7 days for steroids to work.  You may not notice any pain relief for at least one week.  If effective, we will often do a series of injections spaced 3-6 weeks apart to maximally decrease your pain.  If you have any questions, please call [phone] [facility]

## 2019-12-25 IMAGING — CT CT NECK W/ CM
4 of 5 series · 15 of 33 positions shown, 17 images · IV contrast (omnipaque)
Comparison: None.

CLINICAL DATA: Throat pain with difficulty swallowing. Concern for
left peritonsillar abscess.

EXAM:
CT NECK WITH CONTRAST
TECHNIQUE: Multidetector CT imaging of the neck was performed using the
standard protocol following the bolus administration of intravenous
contrast.
CONTRAST:  75mL OMNIPAQUE IOHEXOL 300 MG/ML  SOLN

[Series 3: axial neck · axial · 0.48mm/px · z∈[-814,-692]mm · 3 of 123 slices shown]
[im 31/123  bone]
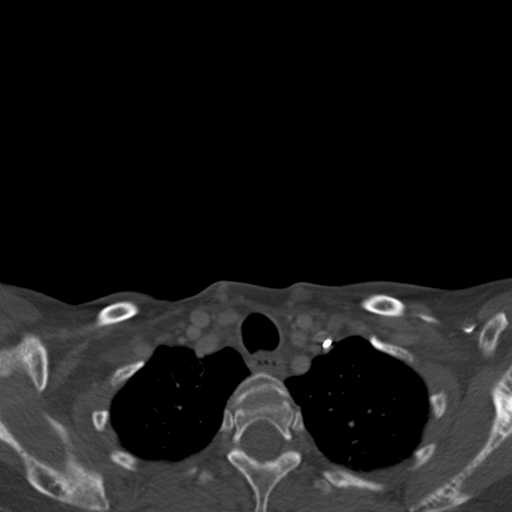
[im 62/123  bone]
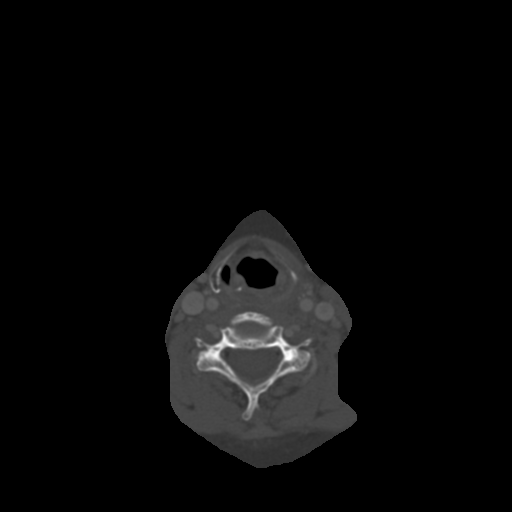
[im 92/123  bone]
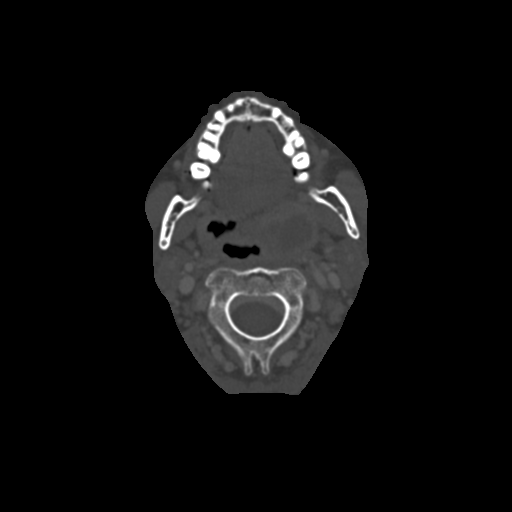

[Series 7: orthogonal ax · axial · 0.46mm/px · z∈[-839,-687]mm · 4 of 128 slices shown, 5 images]
[im 26/128  soft-tissue]
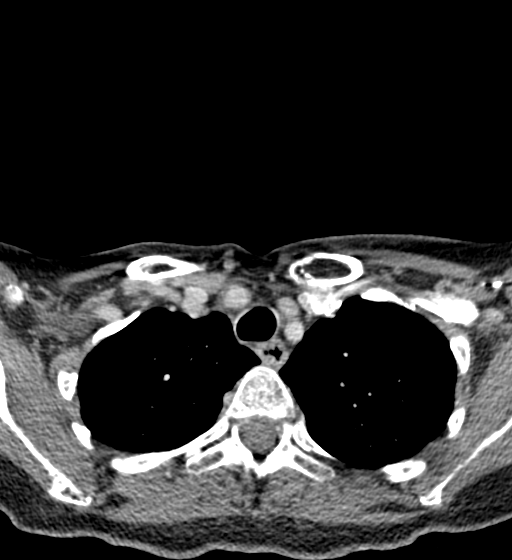
[im 26/128  bone]
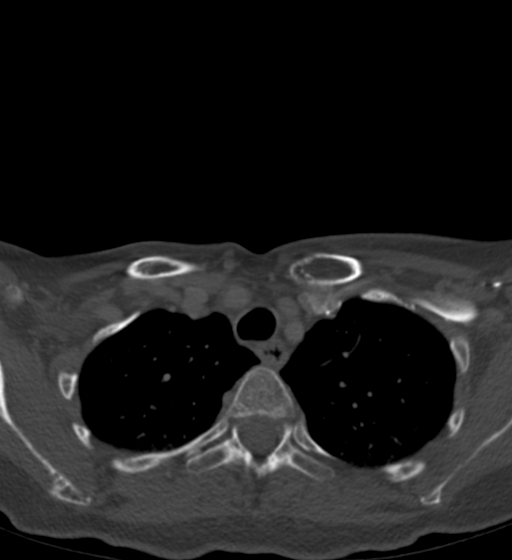
[im 51/128  bone]
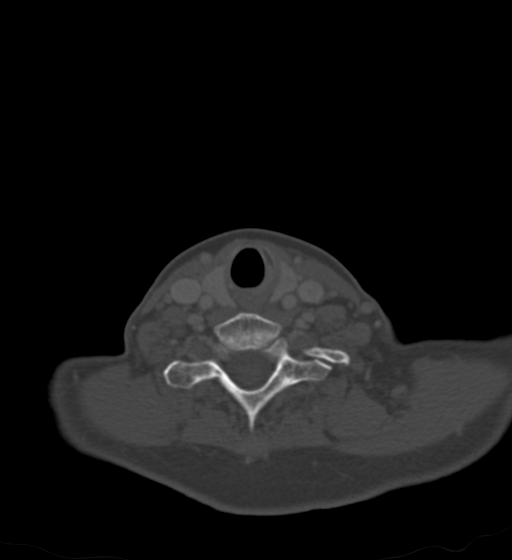
[im 77/128  bone]
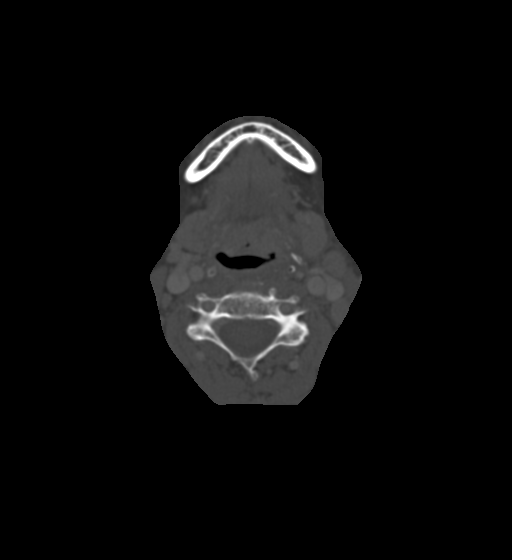
[im 102/128  bone]
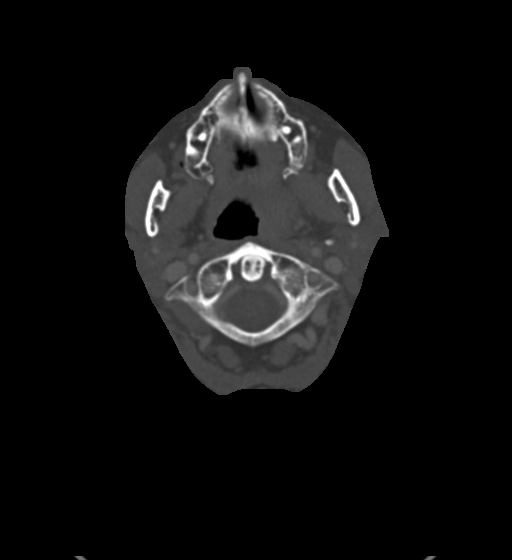

[Series 8: cor neck · coronal · 0.50mm/px · 3 of 117 slices shown]
[im 24/117  bone]
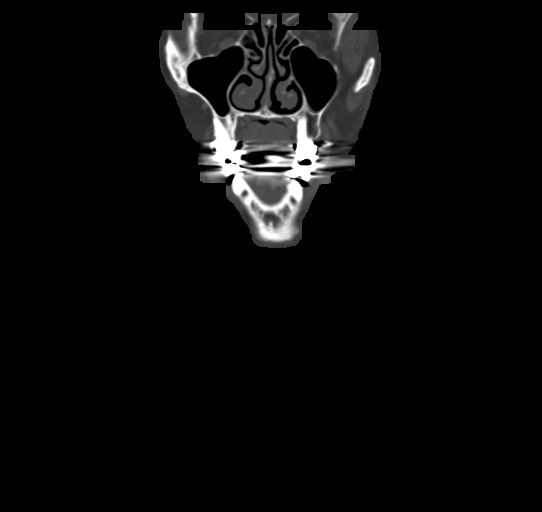
[im 47/117  bone]
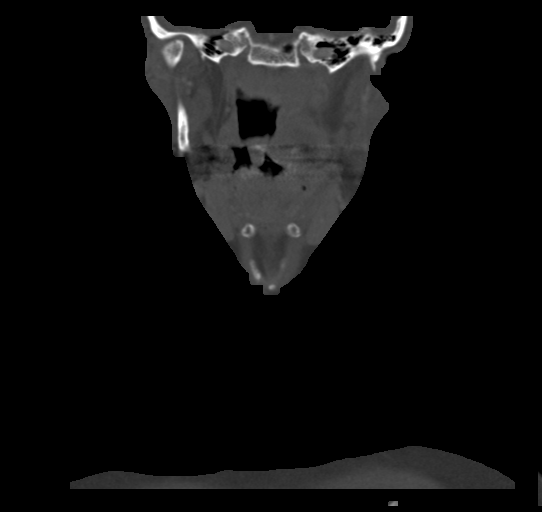
[im 70/117  bone]
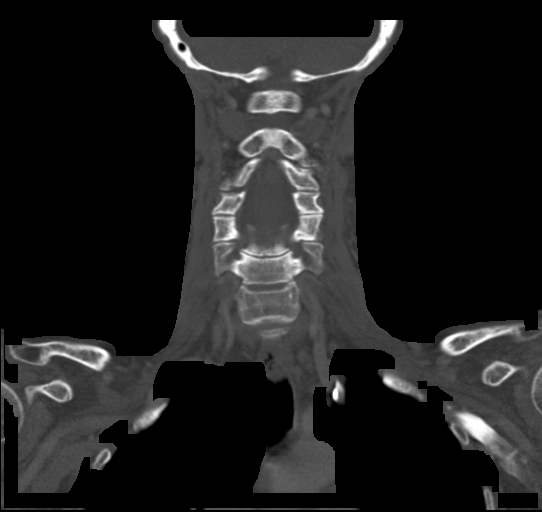

[Series 9: sag neck · sagittal · 0.51mm/px · 5 of 101 slices shown, 6 images]
[im 34/101  bone]
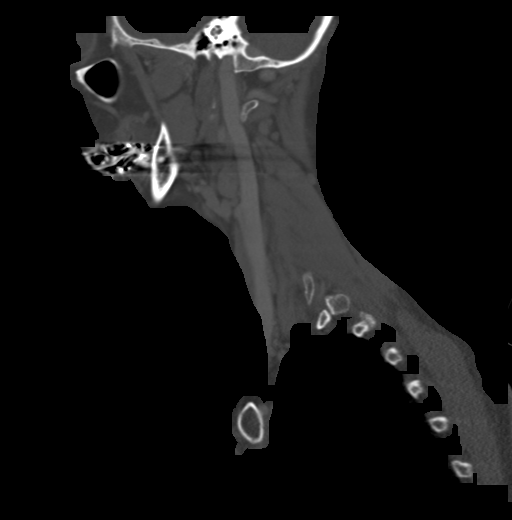
[im 42/101  bone]
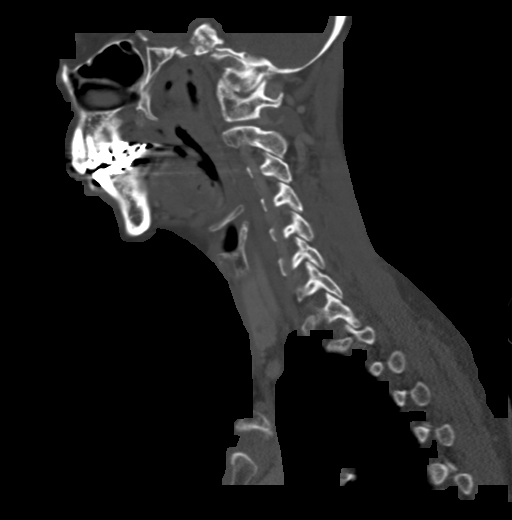
[im 51/101  soft-tissue]
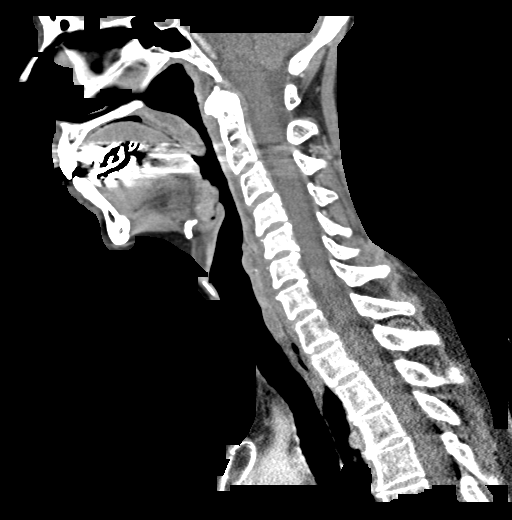
[im 51/101  bone]
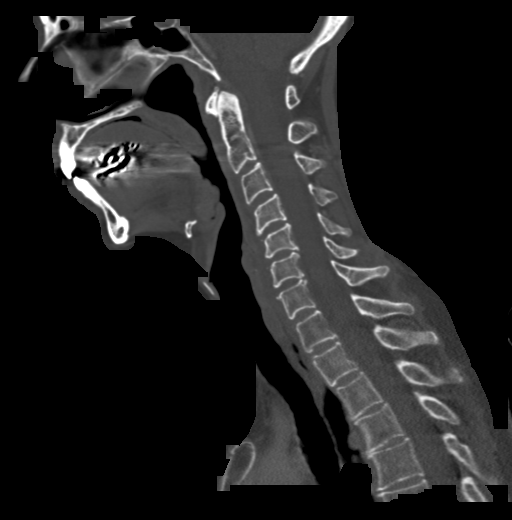
[im 59/101  bone]
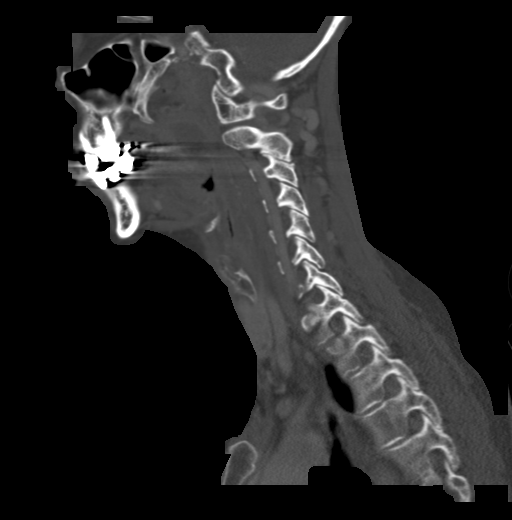
[im 67/101  bone]
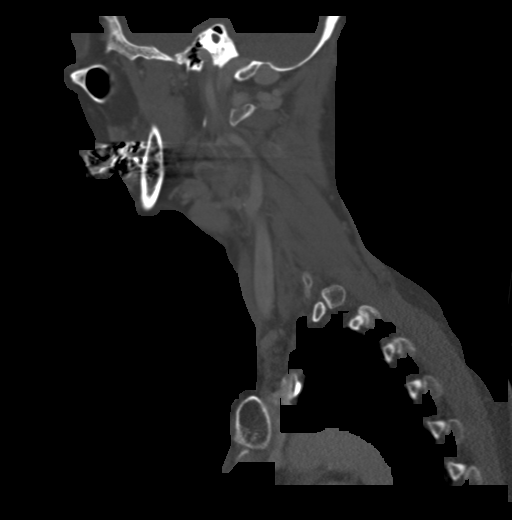

[15 of 33 positions shown; findings below may reference images not displayed]

FINDINGS: Pharynx and larynx: A rim enhancing left peritonsillar fluid
collection is partially obscured by metallic dental streak artifact
though measures approximately 2.0 x 1.7 x 2.0 cm. There is partial
effacement of the oropharynx with rightward deviation of the uvula,
however the airway remains patent. Mucosal edema extends inferiorly
into the left hypopharynx. There is left greater than right
retropharyngeal edema and/or effusion without organized
retropharyngeal fluid collection. Symmetric lingual tonsillar
hypertrophy is noted.

Salivary glands: No inflammation, mass, or stone.

Thyroid: Small thyroid nodules measuring up to 7 mm.

Lymph nodes: Left greater than right cervical lymphadenopathy, most
likely reactive with the largest node measuring 1.3 cm in short axis
on the left in level IIa.

Vascular: Major vascular structures of the neck are patent.

Limited intracranial: Unremarkable.

Visualized orbits: Unremarkable.

Mastoids and visualized paranasal sinuses: Clear.

Skeleton: Mild cervical spondylosis. Moderate left C2-3 facet
arthrosis.

Upper chest: Clear lung apices.

Other: None.
IMPRESSION: 1. 2 cm left peritonsillar abscess.
2. Retropharyngeal edema and/or effusion without organized
retropharyngeal fluid collection.
3. Reactive cervical lymphadenopathy.

## 2020-09-25 DIAGNOSIS — M25552 Pain in left hip: Secondary | ICD-10-CM | POA: Diagnosis not present

## 2020-09-25 DIAGNOSIS — M5416 Radiculopathy, lumbar region: Secondary | ICD-10-CM | POA: Diagnosis not present

## 2020-10-10 DIAGNOSIS — M5442 Lumbago with sciatica, left side: Secondary | ICD-10-CM | POA: Diagnosis not present

## 2020-10-18 DIAGNOSIS — M5442 Lumbago with sciatica, left side: Secondary | ICD-10-CM | POA: Diagnosis not present

## 2020-10-20 DIAGNOSIS — M5442 Lumbago with sciatica, left side: Secondary | ICD-10-CM | POA: Diagnosis not present

## 2020-10-23 DIAGNOSIS — M5442 Lumbago with sciatica, left side: Secondary | ICD-10-CM | POA: Diagnosis not present

## 2020-10-25 DIAGNOSIS — M5442 Lumbago with sciatica, left side: Secondary | ICD-10-CM | POA: Diagnosis not present

## 2020-10-31 DIAGNOSIS — M5442 Lumbago with sciatica, left side: Secondary | ICD-10-CM | POA: Diagnosis not present

## 2020-11-02 DIAGNOSIS — M5442 Lumbago with sciatica, left side: Secondary | ICD-10-CM | POA: Diagnosis not present

## 2020-11-07 DIAGNOSIS — M5442 Lumbago with sciatica, left side: Secondary | ICD-10-CM | POA: Diagnosis not present

## 2020-11-09 DIAGNOSIS — M5442 Lumbago with sciatica, left side: Secondary | ICD-10-CM | POA: Diagnosis not present

## 2020-11-13 DIAGNOSIS — M5442 Lumbago with sciatica, left side: Secondary | ICD-10-CM | POA: Diagnosis not present

## 2020-11-16 DIAGNOSIS — M5442 Lumbago with sciatica, left side: Secondary | ICD-10-CM | POA: Diagnosis not present

## 2020-11-29 DIAGNOSIS — M5416 Radiculopathy, lumbar region: Secondary | ICD-10-CM | POA: Diagnosis not present

## 2020-12-29 DIAGNOSIS — Z1231 Encounter for screening mammogram for malignant neoplasm of breast: Secondary | ICD-10-CM | POA: Diagnosis not present

## 2021-01-02 DIAGNOSIS — M461 Sacroiliitis, not elsewhere classified: Secondary | ICD-10-CM | POA: Diagnosis not present

## 2021-01-02 DIAGNOSIS — M47816 Spondylosis without myelopathy or radiculopathy, lumbar region: Secondary | ICD-10-CM | POA: Diagnosis not present

## 2021-01-29 DIAGNOSIS — M461 Sacroiliitis, not elsewhere classified: Secondary | ICD-10-CM | POA: Diagnosis not present

## 2021-02-13 DIAGNOSIS — M810 Age-related osteoporosis without current pathological fracture: Secondary | ICD-10-CM | POA: Diagnosis not present

## 2021-02-13 DIAGNOSIS — Z136 Encounter for screening for cardiovascular disorders: Secondary | ICD-10-CM | POA: Diagnosis not present

## 2021-02-13 DIAGNOSIS — Z79899 Other long term (current) drug therapy: Secondary | ICD-10-CM | POA: Diagnosis not present

## 2021-02-14 DIAGNOSIS — H6011 Cellulitis of right external ear: Secondary | ICD-10-CM | POA: Diagnosis not present

## 2021-02-19 DIAGNOSIS — M7062 Trochanteric bursitis, left hip: Secondary | ICD-10-CM | POA: Diagnosis not present

## 2021-02-19 DIAGNOSIS — M47816 Spondylosis without myelopathy or radiculopathy, lumbar region: Secondary | ICD-10-CM | POA: Diagnosis not present

## 2021-02-23 DIAGNOSIS — Z86010 Personal history of colonic polyps: Secondary | ICD-10-CM | POA: Diagnosis not present

## 2021-02-23 DIAGNOSIS — Z23 Encounter for immunization: Secondary | ICD-10-CM | POA: Diagnosis not present

## 2021-02-23 DIAGNOSIS — H9113 Presbycusis, bilateral: Secondary | ICD-10-CM | POA: Diagnosis not present

## 2021-02-23 DIAGNOSIS — M810 Age-related osteoporosis without current pathological fracture: Secondary | ICD-10-CM | POA: Diagnosis not present

## 2021-02-23 DIAGNOSIS — M47816 Spondylosis without myelopathy or radiculopathy, lumbar region: Secondary | ICD-10-CM | POA: Diagnosis not present

## 2021-02-23 DIAGNOSIS — Z0000 Encounter for general adult medical examination without abnormal findings: Secondary | ICD-10-CM | POA: Diagnosis not present

## 2021-03-01 DIAGNOSIS — H6011 Cellulitis of right external ear: Secondary | ICD-10-CM | POA: Diagnosis not present

## 2021-04-11 DIAGNOSIS — H5203 Hypermetropia, bilateral: Secondary | ICD-10-CM | POA: Diagnosis not present

## 2021-04-11 DIAGNOSIS — H2513 Age-related nuclear cataract, bilateral: Secondary | ICD-10-CM | POA: Diagnosis not present

## 2021-04-11 DIAGNOSIS — H524 Presbyopia: Secondary | ICD-10-CM | POA: Diagnosis not present

## 2021-05-23 DIAGNOSIS — K921 Melena: Secondary | ICD-10-CM | POA: Diagnosis not present

## 2021-05-23 DIAGNOSIS — K5909 Other constipation: Secondary | ICD-10-CM | POA: Diagnosis not present

## 2022-01-07 DIAGNOSIS — Z1231 Encounter for screening mammogram for malignant neoplasm of breast: Secondary | ICD-10-CM | POA: Diagnosis not present

## 2022-01-28 DIAGNOSIS — Z780 Asymptomatic menopausal state: Secondary | ICD-10-CM | POA: Diagnosis not present

## 2022-01-28 DIAGNOSIS — M8589 Other specified disorders of bone density and structure, multiple sites: Secondary | ICD-10-CM | POA: Diagnosis not present

## 2022-02-28 DIAGNOSIS — M810 Age-related osteoporosis without current pathological fracture: Secondary | ICD-10-CM | POA: Diagnosis not present

## 2022-02-28 DIAGNOSIS — Z79899 Other long term (current) drug therapy: Secondary | ICD-10-CM | POA: Diagnosis not present

## 2022-03-06 DIAGNOSIS — F419 Anxiety disorder, unspecified: Secondary | ICD-10-CM | POA: Diagnosis not present

## 2022-03-06 DIAGNOSIS — M810 Age-related osteoporosis without current pathological fracture: Secondary | ICD-10-CM | POA: Diagnosis not present

## 2022-03-06 DIAGNOSIS — Z800 Family history of malignant neoplasm of digestive organs: Secondary | ICD-10-CM | POA: Diagnosis not present

## 2022-03-06 DIAGNOSIS — Z0000 Encounter for general adult medical examination without abnormal findings: Secondary | ICD-10-CM | POA: Diagnosis not present

## 2022-03-06 DIAGNOSIS — B001 Herpesviral vesicular dermatitis: Secondary | ICD-10-CM | POA: Diagnosis not present

## 2022-03-06 DIAGNOSIS — Z1389 Encounter for screening for other disorder: Secondary | ICD-10-CM | POA: Diagnosis not present

## 2022-03-06 DIAGNOSIS — H9113 Presbycusis, bilateral: Secondary | ICD-10-CM | POA: Diagnosis not present

## 2022-03-06 DIAGNOSIS — M47816 Spondylosis without myelopathy or radiculopathy, lumbar region: Secondary | ICD-10-CM | POA: Diagnosis not present

## 2022-03-06 DIAGNOSIS — Z8041 Family history of malignant neoplasm of ovary: Secondary | ICD-10-CM | POA: Diagnosis not present

## 2022-03-06 DIAGNOSIS — Z6822 Body mass index (BMI) 22.0-22.9, adult: Secondary | ICD-10-CM | POA: Diagnosis not present

## 2022-03-06 DIAGNOSIS — J302 Other seasonal allergic rhinitis: Secondary | ICD-10-CM | POA: Diagnosis not present

## 2022-03-07 ENCOUNTER — Other Ambulatory Visit: Payer: Self-pay | Admitting: Family Medicine

## 2022-03-07 DIAGNOSIS — Z8041 Family history of malignant neoplasm of ovary: Secondary | ICD-10-CM

## 2022-04-12 DIAGNOSIS — H5203 Hypermetropia, bilateral: Secondary | ICD-10-CM | POA: Diagnosis not present

## 2022-04-12 DIAGNOSIS — H2513 Age-related nuclear cataract, bilateral: Secondary | ICD-10-CM | POA: Diagnosis not present

## 2022-04-12 DIAGNOSIS — H52203 Unspecified astigmatism, bilateral: Secondary | ICD-10-CM | POA: Diagnosis not present

## 2022-07-11 DIAGNOSIS — K589 Irritable bowel syndrome without diarrhea: Secondary | ICD-10-CM | POA: Diagnosis not present

## 2022-07-11 DIAGNOSIS — Z800 Family history of malignant neoplasm of digestive organs: Secondary | ICD-10-CM | POA: Diagnosis not present

## 2022-07-11 DIAGNOSIS — Z86010 Personal history of colonic polyps: Secondary | ICD-10-CM | POA: Diagnosis not present

## 2022-08-20 DIAGNOSIS — Z09 Encounter for follow-up examination after completed treatment for conditions other than malignant neoplasm: Secondary | ICD-10-CM | POA: Diagnosis not present

## 2022-08-20 DIAGNOSIS — Z86010 Personal history of colonic polyps: Secondary | ICD-10-CM | POA: Diagnosis not present

## 2022-08-20 DIAGNOSIS — D120 Benign neoplasm of cecum: Secondary | ICD-10-CM | POA: Diagnosis not present

## 2022-08-22 DIAGNOSIS — D120 Benign neoplasm of cecum: Secondary | ICD-10-CM | POA: Diagnosis not present

## 2023-01-14 DIAGNOSIS — Z1231 Encounter for screening mammogram for malignant neoplasm of breast: Secondary | ICD-10-CM | POA: Diagnosis not present

## 2023-02-24 DIAGNOSIS — Z6822 Body mass index (BMI) 22.0-22.9, adult: Secondary | ICD-10-CM | POA: Diagnosis not present

## 2023-02-24 DIAGNOSIS — R21 Rash and other nonspecific skin eruption: Secondary | ICD-10-CM | POA: Diagnosis not present

## 2023-03-10 DIAGNOSIS — Z6822 Body mass index (BMI) 22.0-22.9, adult: Secondary | ICD-10-CM | POA: Diagnosis not present

## 2023-03-10 DIAGNOSIS — Z0000 Encounter for general adult medical examination without abnormal findings: Secondary | ICD-10-CM | POA: Diagnosis not present

## 2023-03-10 DIAGNOSIS — Z1389 Encounter for screening for other disorder: Secondary | ICD-10-CM | POA: Diagnosis not present

## 2023-03-17 DIAGNOSIS — Z6822 Body mass index (BMI) 22.0-22.9, adult: Secondary | ICD-10-CM | POA: Diagnosis not present

## 2023-03-17 DIAGNOSIS — M810 Age-related osteoporosis without current pathological fracture: Secondary | ICD-10-CM | POA: Diagnosis not present

## 2023-03-17 DIAGNOSIS — B001 Herpesviral vesicular dermatitis: Secondary | ICD-10-CM | POA: Diagnosis not present

## 2023-03-17 DIAGNOSIS — F419 Anxiety disorder, unspecified: Secondary | ICD-10-CM | POA: Diagnosis not present

## 2023-04-16 DIAGNOSIS — H524 Presbyopia: Secondary | ICD-10-CM | POA: Diagnosis not present

## 2023-04-16 DIAGNOSIS — H2513 Age-related nuclear cataract, bilateral: Secondary | ICD-10-CM | POA: Diagnosis not present

## 2023-05-28 ENCOUNTER — Emergency Department (HOSPITAL_COMMUNITY): Payer: PPO

## 2023-05-28 ENCOUNTER — Emergency Department (HOSPITAL_COMMUNITY)
Admission: EM | Admit: 2023-05-28 | Discharge: 2023-05-28 | Disposition: A | Discharge status: Home / Self Care | Payer: PPO | Attending: Emergency Medicine | Admitting: Emergency Medicine

## 2023-05-28 ENCOUNTER — Other Ambulatory Visit: Payer: Self-pay

## 2023-05-28 ENCOUNTER — Encounter (HOSPITAL_COMMUNITY): Payer: Self-pay | Admitting: Emergency Medicine

## 2023-05-28 DIAGNOSIS — M542 Cervicalgia: Secondary | ICD-10-CM | POA: Insufficient documentation

## 2023-05-28 DIAGNOSIS — S0101XA Laceration without foreign body of scalp, initial encounter: Secondary | ICD-10-CM | POA: Insufficient documentation

## 2023-05-28 DIAGNOSIS — R9431 Abnormal electrocardiogram [ECG] [EKG]: Secondary | ICD-10-CM | POA: Diagnosis not present

## 2023-05-28 DIAGNOSIS — W19XXXA Unspecified fall, initial encounter: Secondary | ICD-10-CM | POA: Insufficient documentation

## 2023-05-28 DIAGNOSIS — S0003XA Contusion of scalp, initial encounter: Secondary | ICD-10-CM | POA: Diagnosis not present

## 2023-05-28 DIAGNOSIS — Z23 Encounter for immunization: Secondary | ICD-10-CM | POA: Diagnosis not present

## 2023-05-28 DIAGNOSIS — S0990XA Unspecified injury of head, initial encounter: Secondary | ICD-10-CM

## 2023-05-28 DIAGNOSIS — Y92481 Parking lot as the place of occurrence of the external cause: Secondary | ICD-10-CM | POA: Diagnosis not present

## 2023-05-28 DIAGNOSIS — Z743 Need for continuous supervision: Secondary | ICD-10-CM | POA: Diagnosis not present

## 2023-05-28 DIAGNOSIS — R6889 Other general symptoms and signs: Secondary | ICD-10-CM | POA: Diagnosis not present

## 2023-05-28 DIAGNOSIS — S199XXA Unspecified injury of neck, initial encounter: Secondary | ICD-10-CM | POA: Diagnosis not present

## 2023-05-28 DIAGNOSIS — M545 Low back pain, unspecified: Secondary | ICD-10-CM | POA: Diagnosis not present

## 2023-05-28 DIAGNOSIS — G8929 Other chronic pain: Secondary | ICD-10-CM | POA: Insufficient documentation

## 2023-05-28 DIAGNOSIS — R58 Hemorrhage, not elsewhere classified: Secondary | ICD-10-CM | POA: Diagnosis not present

## 2023-05-28 LAB — CBC WITH DIFFERENTIAL/PLATELET
Abs Immature Granulocytes: 0 10*3/uL (ref 0.00–0.07)
Basophils Absolute: 0 10*3/uL (ref 0.0–0.1)
Basophils Relative: 0 %
Eosinophils Absolute: 0 10*3/uL (ref 0.0–0.5)
Eosinophils Relative: 0 %
HCT: 41.9 % (ref 36.0–46.0)
Hemoglobin: 13.9 g/dL (ref 12.0–15.0)
Immature Granulocytes: 0 %
Lymphocytes Relative: 36 %
Lymphs Abs: 1.7 10*3/uL (ref 0.7–4.0)
MCH: 30.1 pg (ref 26.0–34.0)
MCHC: 33.2 g/dL (ref 30.0–36.0)
MCV: 90.7 fL (ref 80.0–100.0)
Monocytes Absolute: 0.4 10*3/uL (ref 0.1–1.0)
Monocytes Relative: 7 %
Neutro Abs: 2.8 10*3/uL (ref 1.7–7.7)
Neutrophils Relative %: 57 %
Platelets: 265 10*3/uL (ref 150–400)
RBC: 4.62 MIL/uL (ref 3.87–5.11)
RDW: 12.3 % (ref 11.5–15.5)
WBC: 4.9 10*3/uL (ref 4.0–10.5)
nRBC: 0 % (ref 0.0–0.2)

## 2023-05-28 LAB — BASIC METABOLIC PANEL
Anion gap: 7 (ref 5–15)
BUN: 13 mg/dL (ref 8–23)
CO2: 27 mmol/L (ref 22–32)
Calcium: 9.7 mg/dL (ref 8.9–10.3)
Chloride: 102 mmol/L (ref 98–111)
Creatinine, Ser: 0.83 mg/dL (ref 0.44–1.00)
GFR, Estimated: >60 mL/min (ref >60)
Glucose, Bld: 102 mg/dL — ABNORMAL HIGH (ref 70–99)
Potassium: 3.7 mmol/L (ref 3.5–5.1)
Sodium: 136 mmol/L (ref 135–145)

## 2023-05-28 MED ORDER — TETANUS-DIPHTH-ACELL PERTUSSIS 5-2.5-18.5 LF-MCG/0.5 IM SUSY
0.5000 mL | PREFILLED_SYRINGE | Freq: Once | INTRAMUSCULAR | Status: AC
  Administered 2023-05-28: 0.5 mL via INTRAMUSCULAR
  Filled 2023-05-28: qty 0.5

## 2023-05-28 NOTE — ED Provider Notes (Signed)
[facility] [facility]ERGENCY DEPARTMENT AT [facility] Provider Note   CSN: [id number] Arrival date & time: 05/28/23  1030     History  Chief Complaint  Patient presents with   Fall   Head Laceration    Jeanette Knapp is a 77 y.o. female.  Patient was at the [facility] parking lot.  Bent over to put in COVID for parking and fell backwards and struck the back of her head.  No significant loss of consciousness.  Not sure when her last tetanus was may have been more than 5 years ago.  Patient not on any blood thinners.  Patient did ambulate some at the scene.  Patient has some chronic neck and back pain.  But nothing seems to be new or worse today.  Past medical history significant for osteopenia irritable bowel syndrome degenerative joint disease.  Patient does not use tobacco products.  Patient suffered laceration to the back of her head and there was extensive bleeding.  Patient is fairly certain she did not pass out.  Leading to the fall.       Home Medications Prior to Admission medications   Medication Sig Start Date End Date Taking? Authorizing Provider  acetaminophen (TYLENOL) 650 MG CR tablet Take 650 mg by mouth every 8 (eight) hours as needed for pain.   Yes [provider]  acyclovir (ZOVIRAX) 400 MG tablet Take 400 mg by mouth every 8 (eight) hours as needed (Flare Ups).   Yes [provider]  alendronate (FOSAMAX) 70 MG tablet Take 70 mg by mouth once a week.   Yes [provider]  citalopram (CELEXA) 20 MG tablet Take 20 mg by mouth daily. 10/15/19  Yes [provider]  fluticasone (FLONASE) 50 MCG/ACT nasal spray Place 1 spray into both nostrils daily as needed for allergies or rhinitis.   Yes [provider]  ibuprofen (ADVIL) 200 MG tablet Take 200-400 mg by mouth every 6 (six) hours as needed for moderate pain (pain score 4-6).   Yes [provider]  Multiple Vitamin (MULTIVITAMIN) capsule Take 1 capsule by mouth  daily.     Yes [provider]  vitamin E (VITAMIN E) 180 MG (400 UNITS) capsule Take 400 Units by mouth daily.   Yes [provider]      Allergies    Penicillins, Prozac [fluoxetine hcl], and Clindamycin/lincomycin    Review of Systems   Review of Systems  Constitutional:  Negative for chills and fever.  HENT:  Negative for ear pain and sore throat.   Eyes:  Negative for pain and visual disturbance.  Respiratory:  Negative for cough and shortness of breath.   Cardiovascular:  Negative for chest pain and palpitations.  Gastrointestinal:  Negative for abdominal pain and vomiting.  Genitourinary:  Negative for dysuria and hematuria.  Musculoskeletal:  Negative for arthralgias and back pain.  Skin:  Positive for wound. Negative for color change and rash.  Neurological:  Positive for headaches. Negative for seizures and syncope.  Hematological:  Does not bruise/bleed easily.  All other systems reviewed and are negative.   Physical Exam Updated Vital Signs BP (!) 138/90   Pulse 80   Temp 97.9 F (36.6 C) (Oral)   Resp 18   Ht 1.638 m (5' 4.5")   Wt 59 kg   SpO2 100%   BMI 21.97 kg/m  Physical Exam Vitals and nursing note reviewed.  Constitutional:      General: She is not in acute distress.  Appearance: Normal appearance. She is well-developed.  HENT:     Head: Normocephalic.     Comments: Laceration to posterior scalp.  2.5 cm irregular. Eyes:     Extraocular Movements: Extraocular movements intact.     Conjunctiva/sclera: Conjunctivae normal.     Pupils: Pupils are equal, round, and reactive to light.  Cardiovascular:     Rate and Rhythm: Normal rate and regular rhythm.     Heart sounds: No murmur heard. Pulmonary:     Effort: Pulmonary effort is normal. No respiratory distress.     Breath sounds: Normal breath sounds.  Abdominal:     Palpations: Abdomen is soft.     Tenderness: There is no abdominal tenderness.  Musculoskeletal:         General: No swelling, tenderness or signs of injury.     Cervical back: Neck supple.  Skin:    General: Skin is warm and dry.     Capillary Refill: Capillary refill takes less than 2 seconds.  Neurological:     General: No focal deficit present.     Mental Status: She is alert and oriented to person, place, and time.     Cranial Nerves: No cranial nerve deficit.     Sensory: No sensory deficit.     Coordination: Coordination abnormal.  Psychiatric:        Mood and Affect: Mood normal.     ED Results / Procedures / Treatments   Labs (all labs ordered are listed, but only abnormal results are displayed) Labs Reviewed  BASIC METABOLIC PANEL - Abnormal; Notable for the following components:      Result Value   Glucose, Bld 102 (*)    All other components within normal limits  CBC WITH DIFFERENTIAL/PLATELET    EKG EKG Interpretation Date/Time:  Wednesday May 28 2023 10:44:26 EST Ventricular Rate:  70 PR Interval:  192 QRS Duration:  101 QT Interval:  393 QTC Calculation: 424 R Axis:   93  Text Interpretation: Sinus rhythm Ventricular premature complex Right axis deviation No previous ECGs available Confirmed by Vanetta Mulders [phone]) on 05/28/2023 10:55:33 AM  Radiology CT Head Wo Contrast  Result Date: 05/28/2023 CLINICAL DATA:  Minor head trauma. Fall with posterior head laceration. EXAM: CT HEAD WITHOUT CONTRAST CT CERVICAL SPINE WITHOUT CONTRAST TECHNIQUE: Multidetector CT imaging of the head and cervical spine was performed following the standard protocol without intravenous contrast. Multiplanar CT image reconstructions of the cervical spine were also generated. RADIATION DOSE REDUCTION: This exam was performed according to the departmental dose-optimization program which includes automated exposure control, adjustment of the mA and/or kV according to patient size and/or use of iterative reconstruction technique. COMPARISON:  None Available. FINDINGS: CT HEAD  FINDINGS Brain: No evidence of acute infarction, hemorrhage, hydrocephalus, extra-axial collection or mass lesion/mass effect. Vascular: No hyperdense vessel or unexpected calcification. Skull: Left parietal scalp hematoma without calvarial fracture. Sinuses/Orbits: No acute finding. CT CERVICAL SPINE FINDINGS Alignment: Normal. Skull base and vertebrae: No acute fracture. No primary bone lesion or focal pathologic process. Soft tissues and spinal canal: No prevertebral fluid or swelling. No visible canal hematoma. Disc levels:  No significant degenerative change for age. Upper chest: No evidence of injury IMPRESSION: No evidence of acute intracranial or cervical spine injury. Electronically Signed   By: Tiburcio Pea M.D.   On: 05/28/2023 12:58   CT Cervical Spine Wo Contrast  Result Date: 05/28/2023 CLINICAL DATA:  Minor head trauma. Fall with posterior head laceration. EXAM: CT HEAD WITHOUT  CONTRAST CT CERVICAL SPINE WITHOUT CONTRAST TECHNIQUE: Multidetector CT imaging of the head and cervical spine was performed following the standard protocol without intravenous contrast. Multiplanar CT image reconstructions of the cervical spine were also generated. RADIATION DOSE REDUCTION: This exam was performed according to the departmental dose-optimization program which includes automated exposure control, adjustment of the mA and/or kV according to patient size and/or use of iterative reconstruction technique. COMPARISON:  None Available. FINDINGS: CT HEAD FINDINGS Brain: No evidence of acute infarction, hemorrhage, hydrocephalus, extra-axial collection or mass lesion/mass effect. Vascular: No hyperdense vessel or unexpected calcification. Skull: Left parietal scalp hematoma without calvarial fracture. Sinuses/Orbits: No acute finding. CT CERVICAL SPINE FINDINGS Alignment: Normal. Skull base and vertebrae: No acute fracture. No primary bone lesion or focal pathologic process. Soft tissues and spinal canal: No  prevertebral fluid or swelling. No visible canal hematoma. Disc levels:  No significant degenerative change for age. Upper chest: No evidence of injury IMPRESSION: No evidence of acute intracranial or cervical spine injury. Electronically Signed   By: Tiburcio Pea M.D.   On: 05/28/2023 12:58    Procedures .Laceration Repair  Date/Time: 05/28/2023 2:27 PM  Performed by: Vanetta Mulders, MD Authorized by: Vanetta Mulders, MD   Consent:    Consent obtained:  Verbal   Consent given by:  Patient   Risks, benefits, and alternatives were discussed: yes     Risks discussed:  Infection, pain and poor wound healing   Alternatives discussed:  No treatment Universal protocol:    Procedure explained and questions answered to patient or proxy's satisfaction: yes     Relevant documents present and verified: yes     Test results available: yes     Imaging studies available: yes     Site/side marked: no     Immediately prior to procedure, a time out was called: yes     Patient identity confirmed:  Verbally with patient Anesthesia:    Anesthesia method:  None Laceration details:    Location:  Scalp   Length (cm):  2.5   Depth (mm):  3 Exploration:    Hemostasis achieved with:  Direct pressure   Contaminated: no   Treatment:    Area cleansed with:  Saline   Amount of cleaning:  Standard   Irrigation solution:  Sterile saline   Debridement:  None   Undermining:  None Skin repair:    Repair method:  Staples   Number of staples:  3 Approximation:    Approximation:  Loose Repair type:    Repair type:  Simple Post-procedure details:    Dressing:  Antibiotic ointment   Procedure completion:  Tolerated well, no immediate complications     Medications Ordered in ED Medications  Tdap (BOOSTRIX) injection 0.5 mL (0.5 mLs Intramuscular Given 05/28/23 1058)    ED Course/ Medical Decision Making/ A&P                                 Medical Decision Making Amount and/or Complexity of  Data Reviewed Labs: ordered. Radiology: ordered.  Risk Prescription drug management.   Will update her tetanus.  Will get basic labs will get CT head and neck.  Does not appear to have any significant neck tenderness but could be distracting injury with the posterior part of the head hurting.  CBC white count 4.9 hemoglobin 13.9 platelets 265.  Basic metabolic panel without any electrolyte abnormalities and renal functions normal.  CT  head and CT neck without any acute findings.  Scalp wound about a 2.5 cm irregular laceration bleeding controlled.  Closed with surgical staples.  Documented in procedure note.   Final Clinical Impression(s) / ED Diagnoses Final diagnoses:  Fall, initial encounter  Laceration of scalp, initial encounter  Injury of head, initial encounter    Rx / DC Orders ED Discharge Orders     None         Vanetta Mulders, MD 05/28/23 1429

## 2023-05-28 NOTE — Discharge Instructions (Addendum)
Staple removal in 7 to 10 days.  Keep wound dry for 24 hours then can shower.  Apply antibiotic ointment after each shower.  Since you have a penicillin allergy would recommend using bacitracin antibiotic ointment.  CT head and neck without any acute findings.

## 2023-05-28 NOTE — ED Triage Notes (Signed)
Patient presents via EMS from a parking lot. Per EMS, patient stated that she bent over, lost her balance, and fell backwards on her head. Per EMS, patient has a laceration to her posterior head. Per EMS, patient does not take any blood thinners. Patient states that the back of her head hurts upon arrival. Patient states that she may have lost consciousness but isn't sure and the fall was unwitnessed. Per EMS, patient was ambulatory on the scene.  CBG 112

## 2023-06-05 DIAGNOSIS — Z4802 Encounter for removal of sutures: Secondary | ICD-10-CM | POA: Diagnosis not present

## 2023-06-05 DIAGNOSIS — S0083XD Contusion of other part of head, subsequent encounter: Secondary | ICD-10-CM | POA: Diagnosis not present

## 2023-10-21 DIAGNOSIS — H2513 Age-related nuclear cataract, bilateral: Secondary | ICD-10-CM | POA: Diagnosis not present

## 2023-10-21 DIAGNOSIS — H524 Presbyopia: Secondary | ICD-10-CM | POA: Diagnosis not present

## 2023-12-31 DIAGNOSIS — H2513 Age-related nuclear cataract, bilateral: Secondary | ICD-10-CM | POA: Diagnosis not present

## 2024-01-06 DIAGNOSIS — H2511 Age-related nuclear cataract, right eye: Secondary | ICD-10-CM | POA: Diagnosis not present

## 2024-01-06 DIAGNOSIS — Z961 Presence of intraocular lens: Secondary | ICD-10-CM | POA: Diagnosis not present

## 2024-01-06 DIAGNOSIS — H25811 Combined forms of age-related cataract, right eye: Secondary | ICD-10-CM | POA: Diagnosis not present

## 2024-01-20 DIAGNOSIS — H25811 Combined forms of age-related cataract, right eye: Secondary | ICD-10-CM | POA: Diagnosis not present

## 2024-01-20 DIAGNOSIS — Z961 Presence of intraocular lens: Secondary | ICD-10-CM | POA: Diagnosis not present

## 2024-01-20 DIAGNOSIS — H2512 Age-related nuclear cataract, left eye: Secondary | ICD-10-CM | POA: Diagnosis not present

## 2024-01-27 DIAGNOSIS — Z1231 Encounter for screening mammogram for malignant neoplasm of breast: Secondary | ICD-10-CM | POA: Diagnosis not present

## 2024-03-08 DIAGNOSIS — Z79899 Other long term (current) drug therapy: Secondary | ICD-10-CM | POA: Diagnosis not present

## 2024-03-10 DIAGNOSIS — B001 Herpesviral vesicular dermatitis: Secondary | ICD-10-CM | POA: Diagnosis not present

## 2024-03-10 DIAGNOSIS — F419 Anxiety disorder, unspecified: Secondary | ICD-10-CM | POA: Diagnosis not present

## 2024-03-10 DIAGNOSIS — D485 Neoplasm of uncertain behavior of skin: Secondary | ICD-10-CM | POA: Diagnosis not present

## 2024-03-10 DIAGNOSIS — Z9109 Other allergy status, other than to drugs and biological substances: Secondary | ICD-10-CM | POA: Diagnosis not present

## 2024-03-10 DIAGNOSIS — Z0000 Encounter for general adult medical examination without abnormal findings: Secondary | ICD-10-CM | POA: Diagnosis not present

## 2024-03-10 DIAGNOSIS — Z1331 Encounter for screening for depression: Secondary | ICD-10-CM | POA: Diagnosis not present

## 2024-03-10 DIAGNOSIS — M810 Age-related osteoporosis without current pathological fracture: Secondary | ICD-10-CM | POA: Diagnosis not present
